# Patient Record
Sex: Female | Born: 1940 | Race: White | Hispanic: No | Marital: Married | Smoking: Never smoker
Health system: Southern US, Community
[De-identification: ages and names within clinical notes are randomized; demographics above are authoritative.]

## PROBLEM LIST (undated history)

## (undated) DIAGNOSIS — E7800 Pure hypercholesterolemia, unspecified: Secondary | ICD-10-CM

## (undated) DIAGNOSIS — M1990 Unspecified osteoarthritis, unspecified site: Secondary | ICD-10-CM

## (undated) DIAGNOSIS — F419 Anxiety disorder, unspecified: Secondary | ICD-10-CM

## (undated) HISTORY — PX: ABDOMINAL HYSTERECTOMY: SHX81

## (undated) HISTORY — PX: BLADDER SURGERY: SHX569

---

## 2013-07-27 ENCOUNTER — Emergency Department (HOSPITAL_COMMUNITY): Payer: Medicare Other

## 2013-07-27 ENCOUNTER — Emergency Department (HOSPITAL_COMMUNITY)
Admission: EM | Admit: 2013-07-27 | Discharge: 2013-07-27 | Disposition: A | Discharge status: Home / Self Care | Payer: Medicare Other | Attending: Emergency Medicine | Admitting: Emergency Medicine

## 2013-07-27 ENCOUNTER — Encounter (HOSPITAL_COMMUNITY): Payer: Self-pay | Admitting: Emergency Medicine

## 2013-07-27 DIAGNOSIS — M25559 Pain in unspecified hip: Secondary | ICD-10-CM | POA: Insufficient documentation

## 2013-07-27 DIAGNOSIS — S81009A Unspecified open wound, unspecified knee, initial encounter: Secondary | ICD-10-CM | POA: Insufficient documentation

## 2013-07-27 DIAGNOSIS — Y939 Activity, unspecified: Secondary | ICD-10-CM | POA: Insufficient documentation

## 2013-07-27 DIAGNOSIS — Z79899 Other long term (current) drug therapy: Secondary | ICD-10-CM | POA: Insufficient documentation

## 2013-07-27 DIAGNOSIS — Y92009 Unspecified place in unspecified non-institutional (private) residence as the place of occurrence of the external cause: Secondary | ICD-10-CM | POA: Insufficient documentation

## 2013-07-27 DIAGNOSIS — F411 Generalized anxiety disorder: Secondary | ICD-10-CM | POA: Insufficient documentation

## 2013-07-27 DIAGNOSIS — T148XXA Other injury of unspecified body region, initial encounter: Secondary | ICD-10-CM

## 2013-07-27 DIAGNOSIS — Z7982 Long term (current) use of aspirin: Secondary | ICD-10-CM | POA: Insufficient documentation

## 2013-07-27 DIAGNOSIS — E7800 Pure hypercholesterolemia, unspecified: Secondary | ICD-10-CM | POA: Insufficient documentation

## 2013-07-27 DIAGNOSIS — W540XXA Bitten by dog, initial encounter: Secondary | ICD-10-CM | POA: Insufficient documentation

## 2013-07-27 HISTORY — DX: Pure hypercholesterolemia, unspecified: E78.00

## 2013-07-27 HISTORY — DX: Anxiety disorder, unspecified: F41.9

## 2013-07-27 MED ORDER — TRAMADOL HCL 50 MG PO TABS: 50.0000 mg | ORAL_TABLET | Freq: Four times a day (QID) | ORAL | Status: DC | PRN

## 2013-07-27 MED ORDER — AMOXICILLIN-POT CLAVULANATE 875-125 MG PO TABS: 1.0000 | ORAL_TABLET | Freq: Two times a day (BID) | ORAL | Status: DC

## 2013-07-27 MED ORDER — TETANUS-DIPHTH-ACELL PERTUSSIS 5-2.5-18.5 LF-MCG/0.5 IM SUSP
0.5000 mL | Freq: Once | INTRAMUSCULAR | Status: AC
  Administered 2013-07-27: 0.5 mL via INTRAMUSCULAR
  Filled 2013-07-27: qty 0.5
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
## 2015-03-20 DIAGNOSIS — Z1389 Encounter for screening for other disorder: Secondary | ICD-10-CM | POA: Diagnosis not present

## 2015-03-20 DIAGNOSIS — Z01419 Encounter for gynecological examination (general) (routine) without abnormal findings: Secondary | ICD-10-CM | POA: Diagnosis not present

## 2015-03-20 DIAGNOSIS — E782 Mixed hyperlipidemia: Secondary | ICD-10-CM | POA: Diagnosis not present

## 2015-03-20 DIAGNOSIS — Z0000 Encounter for general adult medical examination without abnormal findings: Secondary | ICD-10-CM | POA: Diagnosis not present

## 2015-04-02 ENCOUNTER — Encounter (INDEPENDENT_AMBULATORY_CARE_PROVIDER_SITE_OTHER): Payer: Self-pay | Admitting: *Deleted

## 2015-07-24 DIAGNOSIS — E782 Mixed hyperlipidemia: Secondary | ICD-10-CM | POA: Diagnosis not present

## 2015-07-24 DIAGNOSIS — Z1389 Encounter for screening for other disorder: Secondary | ICD-10-CM | POA: Diagnosis not present

## 2016-10-14 DIAGNOSIS — Z1389 Encounter for screening for other disorder: Secondary | ICD-10-CM | POA: Diagnosis not present

## 2016-10-14 DIAGNOSIS — I10 Essential (primary) hypertension: Secondary | ICD-10-CM | POA: Diagnosis not present

## 2016-10-14 DIAGNOSIS — Z0001 Encounter for general adult medical examination with abnormal findings: Secondary | ICD-10-CM | POA: Diagnosis not present

## 2016-12-05 ENCOUNTER — Encounter (HOSPITAL_COMMUNITY): Payer: Self-pay | Admitting: Emergency Medicine

## 2016-12-05 ENCOUNTER — Emergency Department (HOSPITAL_COMMUNITY): Payer: Medicare Other

## 2016-12-05 ENCOUNTER — Emergency Department (HOSPITAL_COMMUNITY)
Admission: EM | Admit: 2016-12-05 | Discharge: 2016-12-05 | Disposition: A | Discharge status: Home / Self Care | Payer: Medicare Other | Attending: Emergency Medicine | Admitting: Emergency Medicine

## 2016-12-05 DIAGNOSIS — R51 Headache: Secondary | ICD-10-CM | POA: Diagnosis not present

## 2016-12-05 DIAGNOSIS — S96911A Strain of unspecified muscle and tendon at ankle and foot level, right foot, initial encounter: Secondary | ICD-10-CM | POA: Diagnosis not present

## 2016-12-05 DIAGNOSIS — Y92410 Unspecified street and highway as the place of occurrence of the external cause: Secondary | ICD-10-CM | POA: Diagnosis not present

## 2016-12-05 DIAGNOSIS — S199XXA Unspecified injury of neck, initial encounter: Secondary | ICD-10-CM | POA: Diagnosis not present

## 2016-12-05 DIAGNOSIS — Y9389 Activity, other specified: Secondary | ICD-10-CM | POA: Insufficient documentation

## 2016-12-05 DIAGNOSIS — Y999 Unspecified external cause status: Secondary | ICD-10-CM | POA: Insufficient documentation

## 2016-12-05 DIAGNOSIS — Z7982 Long term (current) use of aspirin: Secondary | ICD-10-CM | POA: Insufficient documentation

## 2016-12-05 DIAGNOSIS — S3992XA Unspecified injury of lower back, initial encounter: Secondary | ICD-10-CM | POA: Diagnosis not present

## 2016-12-05 DIAGNOSIS — S39012A Strain of muscle, fascia and tendon of lower back, initial encounter: Secondary | ICD-10-CM | POA: Insufficient documentation

## 2016-12-05 DIAGNOSIS — G9389 Other specified disorders of brain: Secondary | ICD-10-CM | POA: Insufficient documentation

## 2016-12-05 DIAGNOSIS — M25571 Pain in right ankle and joints of right foot: Secondary | ICD-10-CM | POA: Diagnosis not present

## 2016-12-05 DIAGNOSIS — M5489 Other dorsalgia: Secondary | ICD-10-CM | POA: Diagnosis not present

## 2016-12-05 DIAGNOSIS — S161XXA Strain of muscle, fascia and tendon at neck level, initial encounter: Secondary | ICD-10-CM | POA: Insufficient documentation

## 2016-12-05 DIAGNOSIS — M545 Low back pain: Secondary | ICD-10-CM | POA: Diagnosis not present

## 2016-12-05 DIAGNOSIS — M542 Cervicalgia: Secondary | ICD-10-CM | POA: Diagnosis not present

## 2016-12-05 DIAGNOSIS — T148XXA Other injury of unspecified body region, initial encounter: Secondary | ICD-10-CM | POA: Diagnosis not present

## 2016-12-05 DIAGNOSIS — S0990XA Unspecified injury of head, initial encounter: Secondary | ICD-10-CM | POA: Diagnosis not present

## 2016-12-05 MED ORDER — ACETAMINOPHEN 500 MG PO TABS
1000.0000 mg | ORAL_TABLET | Freq: Once | ORAL | Status: AC
  Administered 2016-12-05: 1000 mg via ORAL
  Filled 2016-12-05: qty 2
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
## 2017-06-16 DIAGNOSIS — Z1389 Encounter for screening for other disorder: Secondary | ICD-10-CM | POA: Diagnosis not present
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
## 2017-07-11 DIAGNOSIS — Z1211 Encounter for screening for malignant neoplasm of colon: Secondary | ICD-10-CM | POA: Diagnosis not present

## 2018-01-05 DIAGNOSIS — Z1389 Encounter for screening for other disorder: Secondary | ICD-10-CM | POA: Diagnosis not present

## 2018-01-05 DIAGNOSIS — Z0000 Encounter for general adult medical examination without abnormal findings: Secondary | ICD-10-CM | POA: Diagnosis not present

## 2018-01-05 DIAGNOSIS — E782 Mixed hyperlipidemia: Secondary | ICD-10-CM | POA: Diagnosis not present

## 2019-01-18 DIAGNOSIS — Z1389 Encounter for screening for other disorder: Secondary | ICD-10-CM | POA: Diagnosis not present

## 2019-01-18 DIAGNOSIS — Z0001 Encounter for general adult medical examination with abnormal findings: Secondary | ICD-10-CM | POA: Diagnosis not present

## 2019-02-09 DIAGNOSIS — Z1389 Encounter for screening for other disorder: Secondary | ICD-10-CM | POA: Diagnosis not present

## 2019-02-09 DIAGNOSIS — E7849 Other hyperlipidemia: Secondary | ICD-10-CM | POA: Diagnosis not present

## 2019-06-02 DIAGNOSIS — H2521 Age-related cataract, morgagnian type, right eye: Secondary | ICD-10-CM | POA: Diagnosis not present

## 2019-06-02 DIAGNOSIS — H25813 Combined forms of age-related cataract, bilateral: Secondary | ICD-10-CM | POA: Diagnosis not present

## 2019-06-13 DIAGNOSIS — H2521 Age-related cataract, morgagnian type, right eye: Secondary | ICD-10-CM | POA: Diagnosis not present
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
## 2019-06-16 ENCOUNTER — Other Ambulatory Visit (HOSPITAL_COMMUNITY)
Admission: RE | Admit: 2019-06-16 | Discharge: 2019-06-16 | Disposition: A | Discharge status: Home / Self Care | Payer: Medicare Other | Source: Ambulatory Visit | Attending: Ophthalmology | Admitting: Ophthalmology

## 2019-06-16 ENCOUNTER — Encounter (HOSPITAL_COMMUNITY)
Admission: RE | Admit: 2019-06-16 | Discharge: 2019-06-16 | Disposition: A | Discharge status: Home / Self Care | Payer: Medicare Other | Source: Ambulatory Visit | Attending: Ophthalmology | Admitting: Ophthalmology

## 2019-06-16 ENCOUNTER — Other Ambulatory Visit: Payer: Self-pay

## 2019-06-16 ENCOUNTER — Encounter (HOSPITAL_COMMUNITY): Payer: Self-pay

## 2019-06-16 DIAGNOSIS — Z01812 Encounter for preprocedural laboratory examination: Secondary | ICD-10-CM | POA: Insufficient documentation

## 2019-06-16 DIAGNOSIS — Z20828 Contact with and (suspected) exposure to other viral communicable diseases: Secondary | ICD-10-CM | POA: Diagnosis not present

## 2019-06-16 HISTORY — DX: Unspecified osteoarthritis, unspecified site: M19.90

## 2019-06-16 LAB — SARS CORONAVIRUS 2 (TAT 6-24 HRS): SARS Coronavirus 2: NEGATIVE

## 2019-06-20 ENCOUNTER — Encounter (HOSPITAL_COMMUNITY)
Admission: RE | Disposition: A | Discharge status: Home / Self Care | Payer: Self-pay | Source: Home / Self Care | Attending: Ophthalmology

## 2019-06-20 ENCOUNTER — Ambulatory Visit (HOSPITAL_COMMUNITY): Payer: Medicare Other | Admitting: Anesthesiology

## 2019-06-20 ENCOUNTER — Encounter (HOSPITAL_COMMUNITY): Payer: Self-pay

## 2019-06-20 ENCOUNTER — Other Ambulatory Visit: Payer: Self-pay

## 2019-06-20 ENCOUNTER — Ambulatory Visit (HOSPITAL_COMMUNITY)
Admission: RE | Admit: 2019-06-20 | Discharge: 2019-06-20 | Disposition: A | Discharge status: Home / Self Care | Payer: Medicare Other | Attending: Ophthalmology | Admitting: Ophthalmology

## 2019-06-20 DIAGNOSIS — H25813 Combined forms of age-related cataract, bilateral: Secondary | ICD-10-CM | POA: Insufficient documentation

## 2019-06-20 DIAGNOSIS — Z79899 Other long term (current) drug therapy: Secondary | ICD-10-CM | POA: Insufficient documentation

## 2019-06-20 DIAGNOSIS — M1990 Unspecified osteoarthritis, unspecified site: Secondary | ICD-10-CM | POA: Diagnosis not present

## 2019-06-20 DIAGNOSIS — F419 Anxiety disorder, unspecified: Secondary | ICD-10-CM | POA: Insufficient documentation

## 2019-06-20 DIAGNOSIS — Z7982 Long term (current) use of aspirin: Secondary | ICD-10-CM | POA: Diagnosis not present

## 2019-06-20 DIAGNOSIS — H2521 Age-related cataract, morgagnian type, right eye: Secondary | ICD-10-CM | POA: Diagnosis not present

## 2019-06-20 HISTORY — PX: CATARACT EXTRACTION W/PHACO: SHX586

## 2019-06-20 SURGERY — PHACOEMULSIFICATION, CATARACT, WITH IOL INSERTION
Anesthesia: Monitor Anesthesia Care | Site: Eye | Laterality: Right

## 2019-06-20 MED ORDER — CYCLOPENTOLATE-PHENYLEPHRINE 0.2-1 % OP SOLN
1.0000 [drp] | OPHTHALMIC | Status: AC | PRN
  Administered 2019-06-20 (×3): 1 [drp] via OPHTHALMIC

## 2019-06-20 MED ORDER — LIDOCAINE HCL 3.5 % OP GEL
1.0000 "application " | Freq: Once | OPHTHALMIC | Status: AC
  Administered 2019-06-20: 1 via OPHTHALMIC

## 2019-06-20 MED ORDER — POVIDONE-IODINE 5 % OP SOLN
OPHTHALMIC | Status: DC | PRN
  Administered 2019-06-20: 1 via OPHTHALMIC

## 2019-06-20 MED ORDER — MIDAZOLAM HCL 2 MG/2ML IJ SOLN
INTRAMUSCULAR | Status: DC | PRN
  Administered 2019-06-20 (×2): 0.5 mg via INTRAVENOUS
  Administered 2019-06-20: 1 mg via INTRAVENOUS

## 2019-06-20 MED ORDER — BSS IO SOLN
INTRAOCULAR | Status: DC | PRN
  Administered 2019-06-20: 15 mL via INTRAOCULAR

## 2019-06-20 MED ORDER — TRYPAN BLUE 0.06 % OP SOLN
OPHTHALMIC | Status: DC | PRN
  Administered 2019-06-20: 0.5 mL via INTRAOCULAR

## 2019-06-20 MED ORDER — PROVISC 10 MG/ML IO SOLN
INTRAOCULAR | Status: DC | PRN
  Administered 2019-06-20: 0.85 mL via INTRAOCULAR

## 2019-06-20 MED ORDER — SODIUM HYALURONATE 23 MG/ML IO SOLN
INTRAOCULAR | Status: DC | PRN
  Administered 2019-06-20: 0.6 mL via INTRAOCULAR

## 2019-06-20 MED ORDER — PHENYLEPHRINE-KETOROLAC 1-0.3 % IO SOLN
INTRAOCULAR | Status: AC
  Filled 2019-06-20: qty 4

## 2019-06-20 MED ORDER — PHENYLEPHRINE-KETOROLAC 1-0.3 % IO SOLN
INTRAOCULAR | Status: DC | PRN
  Administered 2019-06-20: 500 mL via OPHTHALMIC

## 2019-06-20 MED ORDER — PHENYLEPHRINE HCL 2.5 % OP SOLN
1.0000 [drp] | OPHTHALMIC | Status: AC | PRN
  Administered 2019-06-20 (×3): 1 [drp] via OPHTHALMIC

## 2019-06-20 MED ORDER — EPINEPHRINE PF 1 MG/ML IJ SOLN
INTRAMUSCULAR | Status: AC
  Filled 2019-06-20: qty 2

## 2019-06-20 MED ORDER — LIDOCAINE HCL (PF) 1 % IJ SOLN
INTRAOCULAR | Status: DC | PRN
  Administered 2019-06-20: 09:00:00 1 mL via OPHTHALMIC

## 2019-06-20 MED ORDER — TRYPAN BLUE 0.06 % OP SOLN
OPHTHALMIC | Status: AC
  Filled 2019-06-20: qty 0.5

## 2019-06-20 MED ORDER — TETRACAINE HCL 0.5 % OP SOLN
1.0000 [drp] | OPHTHALMIC | Status: AC | PRN
  Administered 2019-06-20 (×3): 1 [drp] via OPHTHALMIC

## 2019-06-20 MED ORDER — NEOMYCIN-POLYMYXIN-DEXAMETH 3.5-10000-0.1 OP SUSP
OPHTHALMIC | Status: DC | PRN
  Administered 2019-06-20: 1 [drp] via OPHTHALMIC

## 2019-06-20 MED ORDER — MIDAZOLAM HCL 2 MG/2ML IJ SOLN
INTRAMUSCULAR | Status: AC
  Filled 2019-06-20: qty 2

## 2019-06-20 SURGICAL SUPPLY — 14 items
CLOTH BEACON ORANGE TIMEOUT ST (SAFETY) ×3 IMPLANT
DEVICE MILOOP (MISCELLANEOUS) ×2 IMPLANT
EYE SHIELD UNIVERSAL CLEAR (GAUZE/BANDAGES/DRESSINGS) ×3 IMPLANT
GLOVE BIOGEL PI IND STRL 7.0 (GLOVE) ×2 IMPLANT
GLOVE BIOGEL PI INDICATOR 7.0 (GLOVE) ×4
LENS ALC ACRYL/TECN (Ophthalmic Related) ×3 IMPLANT
MILOOP DEVICE (MISCELLANEOUS) ×6
NEEDLE HYPO 18GX1.5 BLUNT FILL (NEEDLE) ×3 IMPLANT
PAD ARMBOARD 7.5X6 YLW CONV (MISCELLANEOUS) ×3 IMPLANT
SYR TB 1ML LL NO SAFETY (SYRINGE) ×3 IMPLANT
TAPE SURG TRANSPORE 1 IN (GAUZE/BANDAGES/DRESSINGS) ×1 IMPLANT
TAPE SURGICAL TRANSPORE 1 IN (GAUZE/BANDAGES/DRESSINGS) ×2
VISCOELASTIC ADDITIONAL (OPHTHALMIC RELATED) ×3 IMPLANT
WATER STERILE IRR 250ML POUR (IV SOLUTION) ×3 IMPLANT

## 2019-06-20 NOTE — Discharge Instructions (Addendum)

## 2019-06-20 NOTE — Transfer of Care (Signed)
Immediate Anesthesia Transfer of Care Note  Patient: Kimberly Melendez  Procedure(s) Performed: CATARACT EXTRACTION PHACO AND INTRAOCULAR LENS PLACEMENT RIGHT EYE (Right Eye)  Patient Location: Short Stay  Anesthesia Type:MAC  Level of Consciousness: awake, alert , oriented and patient cooperative  Airway & Oxygen Therapy: Patient Spontanous Breathing  Post-op Assessment: Report given to RN and Post -op Vital signs reviewed and stable  Post vital signs: Reviewed and stable  Last Vitals:  Vitals Value Taken Time  BP    Temp    Pulse    Resp    SpO2      Last Pain:  Vitals:   06/20/19 0725  TempSrc: Oral  PainSc: 0-No pain      Patients Stated Pain Goal: 6 (16/10/96 0454)  Complications: No apparent anesthesia complications

## 2019-06-20 NOTE — Anesthesia Preprocedure Evaluation (Signed)
Anesthesia Evaluation  Patient identified by MRN, date of birth, ID band Patient awake    Reviewed: Allergy & Precautions, NPO status , Patient's Chart, lab work & pertinent test results  Airway Mallampati: II  TM Distance: >3 FB     Dental  (+) Partial Upper   Pulmonary    Pulmonary exam normal breath sounds clear to auscultation       Cardiovascular Exercise Tolerance: Good negative cardio ROS Normal cardiovascular exam Rhythm:Regular Rate:Normal     Neuro/Psych Anxiety    GI/Hepatic negative GI ROS, Neg liver ROS,   Endo/Other  negative endocrine ROS  Renal/GU      Musculoskeletal  (+) Arthritis , Osteoarthritis,    Abdominal   Peds  Hematology negative hematology ROS (+)   Anesthesia Other Findings   Reproductive/Obstetrics                             Anesthesia Physical Anesthesia Plan  ASA: II  Anesthesia Plan: MAC   Post-op Pain Management:    Induction:   PONV Risk Score and Plan:   Airway Management Planned: Natural Airway and Nasal Cannula  Additional Equipment:   Intra-op Plan:   Post-operative Plan:   Informed Consent: I have reviewed the patients History and Physical, chart, labs and discussed the procedure including the risks, benefits and alternatives for the proposed anesthesia with the patient or authorized representative who has indicated his/her understanding and acceptance.     Dental advisory given  Plan Discussed with: CRNA  Anesthesia Plan Comments:         Anesthesia Quick Evaluation

## 2019-06-20 NOTE — Anesthesia Postprocedure Evaluation (Signed)
Anesthesia Post Note  Patient: Kimberly Melendez  Procedure(s) Performed: CATARACT EXTRACTION PHACO AND INTRAOCULAR LENS PLACEMENT RIGHT EYE (Right Eye)  Patient location during evaluation: Short Stay Anesthesia Type: MAC Level of consciousness: awake and alert Pain management: pain level controlled Vital Signs Assessment: post-procedure vital signs reviewed and stable Respiratory status: spontaneous breathing Cardiovascular status: stable Anesthetic complications: no     Last Vitals:  Vitals:   06/20/19 0725  BP: (!) 146/76  Pulse: 73  Resp: 16  Temp: 36.6 C  SpO2: 94%    Last Pain:  Vitals:   06/20/19 0725  TempSrc: Oral  PainSc: 0-No pain                 Everette Rank

## 2019-06-20 NOTE — Interval H&P Note (Signed)
History and Physical Interval Note: The H and P was reviewed and updated. The patient was examined.  No changes were found after exam.  The surgical eye was marked.  06/20/2019 8:29 AM  Kimberly Melendez  has presented today for surgery, with the diagnosis of Nuclear sclerotic cataract - Right eye.  The various methods of treatment have been discussed with the patient and family. After consideration of risks, benefits and other options for treatment, the patient has consented to  Procedure(s) with comments: CATARACT EXTRACTION PHACO AND INTRAOCULAR LENS PLACEMENT RIGHT EYE (Right) - right as a surgical intervention.  The patient's history has been reviewed, patient examined, no change in status, stable for surgery.  I have reviewed the patient's chart and labs.  Questions were answered to the patient's satisfaction.     Baruch Goldmann

## 2019-06-20 NOTE — Op Note (Signed)
Date of procedure: 06/20/19  Pre-operative diagnosis: Mature Visually significant age-related cataract, Right Eye (H25.21)  Post-operative diagnosis: Mature Visually significant age-related cataract, Right Eye  Procedure: Complex Removal of cataract via phacoemulsification and insertion of intra-ocular lens AMO PCB00 +16.5D into the capsular bag of the Right Eye  Attending surgeon: Gerda Diss. Deone Leifheit, MD, MA  Anesthesia: MAC, Topical Akten  Complications: None  Estimated Blood Loss: <64m (minimal)  Specimens: None  Implants: As above  Indications:  Mature Visually significant age-related cataract, Right Eye  Procedure:  The patient was seen and identified in the pre-operative area. The operative eye was identified and dilated.  The operative eye was marked.  Topical anesthesia was administered to the operative eye.     The patient was then to the operative suite and placed in the supine position.  A timeout was performed confirming the patient, procedure to be performed, and all other relevant information.   The patient's face was prepped and draped in the usual fashion for intra-ocular surgery.  A lid speculum was placed into the operative eye and the surgical microscope moved into place and focused.  A lack of red reflex due to a mature cataract was confirmed.  A superotemporal paracentesis was created using a 20 gauge paracentesis blade.  Vision blue was injected into the anterior chamber.  Shugarcaine was injected into the anterior chamber.  Viscoelastic was injected into the anterior chamber.  A temporal clear-corneal main wound incision was created using a 2.489mmicrokeratome.  A continuous curvilinear capsulorrhexis was initiated using an irrigating cystitome and completed using capsulorrhexis forceps.  Hydrodissection and hydrodeliniation were performed.  Viscoelastic was injected into the anterior chamber.  A miLoop was used to bisect the nucleus.  A phacoemulsification handpiece and  a chopper as a second instrument were used to remove the nucleus and epinucleus. The irrigation/aspiration handpiece was used to remove any remaining cortical material.   The capsular bag was reinflated with viscoelastic, checked, and found to be intact. The intraocular lens was inserted into the capsular bag and dialed into place using a kuglen hook.  The irrigation/aspiration handpiece was used to remove any remaining viscoelastic.  The clear corneal wound and paracentesis wounds were then hydrated and checked with Weck-Cels to be watertight.  The lid-speculum and drape was removed, and the patient's face was cleaned with a wet and dry 4x4.  Maxitrol was instilled in the eye before a clear shield was taped over the eye. The patient was taken to the post-operative care unit in good condition, having tolerated the procedure well.  Post-Op Instructions: [facility] and will receive all other orders and instructions.

## 2019-06-21 ENCOUNTER — Encounter (HOSPITAL_COMMUNITY): Payer: Self-pay | Admitting: Ophthalmology

## 2019-06-21 NOTE — Addendum Note (Signed)
Addendum  created 06/21/19 0807 by Mickel Baas, CRNA   Charge Capture section accepted

## 2019-06-27 DIAGNOSIS — H25812 Combined forms of age-related cataract, left eye: Secondary | ICD-10-CM | POA: Diagnosis not present

## 2019-06-28 NOTE — H&P (Signed)
Surgical History & Physical  Patient Name: Kimberly Melendez DOB: [birth year]  Surgery: Cataract extraction with intraocular lens implant phacoemulsification; Left Eye  Surgeon: Baruch Goldmann MD Surgery Date:  07/04/2019 Pre-Op Date:  06/27/2019  HPI: [name] 1. 1. The patient complains of difficulty when viewing TV, reading closed caption, news scrolls on TV, which began 6 months ago. The left eye is affected. The episode is gradual. Symptoms occur when the patient is inside, outside and reading. This is negatively affecting the patient's quality of life. 2. The patient is returning after cataract post-op. The right eye is affected. Status post cataract post-op, 1 week ago: Since the last visit, the affected area is doing well. The patient's vision is improved, pt states she really amazed with the result. Patient is following medication instructions. HPI was performed by Baruch Goldmann .  Medical History: Cataracts Anxiety LDL  Review of Systems Negative Allergic/Immunologic Negative Cardiovascular Negative Constitutional Negative Ear, Nose, Mouth & Throat Negative Endocrine Negative Gastrointestinal Negative Genitourinary Negative Hemotologic/Lymphatic Negative Integumentary Negative Musculoskeletal Negative Neurological Negative Respiratory  Social   Never smoked  Medication Prednisolone-gatiflox-bromfenac,  Aspirin, Clonazepam, Pravastatin,   Sx/Procedures Phaco c IOL,  Hysterectomy,   Drug Allergies   NKDA  History & Physical: Heent:  Cataract, Left eye NECK: supple without bruits LUNGS: lungs clear to auscultation CV: regular rate and rhythm Abdomen: soft and non-tender  Impression & Plan: Assessment: 1.  COMBINED FORMS AGE RELATED CATARACT; Left Eye (H25.812) 2.  CATARACT EXTRACTION STATUS; Right Eye (Z98.41) 3.  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  Cataract accounts for the patient's decreased vision. This visual impairment is not  correctable with a tolerable change in glasses or contact lenses. Cataract surgery with an implantation of a new lens should significantly improve the visual and functional status of the patient. Discussed all risks, benefits, alternatives, and potential complications. Discussed the procedures and recovery. Patient desires to have surgery. A-scan ordered and performed today for intra-ocular lens calculations. The surgery will be performed in order to improve vision for driving, reading, and for eye examinations. Recommend phacoemulsification with intra-ocular lens. Left Eye. Surgery required to correct imbalance of vision. Dilates well - shugarcaine by protocol. 2.  1 week after cataract surgery. Doing well with improved vision and normal eye pressure. Call with any problems or concerns. Continue Gati-Brom-Pred 2x/day for 3 more weeks.

## 2019-06-30 ENCOUNTER — Encounter (HOSPITAL_COMMUNITY): Payer: Self-pay

## 2019-06-30 ENCOUNTER — Other Ambulatory Visit: Payer: Self-pay

## 2019-06-30 ENCOUNTER — Encounter (HOSPITAL_COMMUNITY)
Admission: RE | Admit: 2019-06-30 | Discharge: 2019-06-30 | Disposition: A | Discharge status: Home / Self Care | Payer: Medicare Other | Source: Ambulatory Visit | Attending: Ophthalmology | Admitting: Ophthalmology

## 2019-07-01 ENCOUNTER — Other Ambulatory Visit (HOSPITAL_COMMUNITY)
Admission: RE | Admit: 2019-07-01 | Discharge: 2019-07-01 | Disposition: A | Discharge status: Home / Self Care | Payer: Medicare Other | Source: Ambulatory Visit | Attending: Ophthalmology | Admitting: Ophthalmology

## 2019-07-01 ENCOUNTER — Other Ambulatory Visit: Payer: Self-pay

## 2019-07-01 DIAGNOSIS — Z01812 Encounter for preprocedural laboratory examination: Secondary | ICD-10-CM | POA: Insufficient documentation

## 2019-07-01 DIAGNOSIS — Z20828 Contact with and (suspected) exposure to other viral communicable diseases: Secondary | ICD-10-CM | POA: Diagnosis not present

## 2019-07-01 LAB — SARS CORONAVIRUS 2 (TAT 6-24 HRS): SARS Coronavirus 2: NEGATIVE

## 2019-07-04 ENCOUNTER — Encounter (HOSPITAL_COMMUNITY)
Admission: RE | Disposition: A | Discharge status: Home / Self Care | Payer: Self-pay | Source: Home / Self Care | Attending: Ophthalmology

## 2019-07-04 ENCOUNTER — Ambulatory Visit (HOSPITAL_COMMUNITY): Payer: Medicare Other | Admitting: Anesthesiology

## 2019-07-04 ENCOUNTER — Ambulatory Visit (HOSPITAL_COMMUNITY)
Admission: RE | Admit: 2019-07-04 | Discharge: 2019-07-04 | Disposition: A | Discharge status: Home / Self Care | Payer: Medicare Other | Attending: Ophthalmology | Admitting: Ophthalmology

## 2019-07-04 DIAGNOSIS — F419 Anxiety disorder, unspecified: Secondary | ICD-10-CM | POA: Insufficient documentation

## 2019-07-04 DIAGNOSIS — Z7982 Long term (current) use of aspirin: Secondary | ICD-10-CM | POA: Diagnosis not present

## 2019-07-04 DIAGNOSIS — H5203 Hypermetropia, bilateral: Secondary | ICD-10-CM | POA: Insufficient documentation

## 2019-07-04 DIAGNOSIS — H25812 Combined forms of age-related cataract, left eye: Secondary | ICD-10-CM | POA: Insufficient documentation

## 2019-07-04 DIAGNOSIS — Z79899 Other long term (current) drug therapy: Secondary | ICD-10-CM | POA: Diagnosis not present

## 2019-07-04 HISTORY — PX: CATARACT EXTRACTION W/PHACO: SHX586

## 2019-07-04 SURGERY — PHACOEMULSIFICATION, CATARACT, WITH IOL INSERTION
Anesthesia: Monitor Anesthesia Care | Site: Eye | Laterality: Left

## 2019-07-04 MED ORDER — CYCLOPENTOLATE-PHENYLEPHRINE 0.2-1 % OP SOLN
1.0000 [drp] | OPHTHALMIC | Status: AC | PRN
  Administered 2019-07-04 (×3): 1 [drp] via OPHTHALMIC

## 2019-07-04 MED ORDER — TRYPAN BLUE 0.06 % OP SOLN
OPHTHALMIC | Status: AC
  Filled 2019-07-04: qty 0.5

## 2019-07-04 MED ORDER — PHENYLEPHRINE-KETOROLAC 1-0.3 % IO SOLN
INTRAOCULAR | Status: DC | PRN
  Administered 2019-07-04: 500 mL via OPHTHALMIC

## 2019-07-04 MED ORDER — LIDOCAINE HCL 3.5 % OP GEL
1.0000 "application " | Freq: Once | OPHTHALMIC | Status: AC
  Administered 2019-07-04: 1 via OPHTHALMIC

## 2019-07-04 MED ORDER — NEOMYCIN-POLYMYXIN-DEXAMETH 3.5-10000-0.1 OP SUSP
OPHTHALMIC | Status: DC | PRN
  Administered 2019-07-04: 2 [drp] via OPHTHALMIC

## 2019-07-04 MED ORDER — POVIDONE-IODINE 5 % OP SOLN
OPHTHALMIC | Status: DC | PRN
  Administered 2019-07-04: 1 via OPHTHALMIC

## 2019-07-04 MED ORDER — TETRACAINE HCL 0.5 % OP SOLN
1.0000 [drp] | OPHTHALMIC | Status: AC | PRN
  Administered 2019-07-04 (×3): 1 [drp] via OPHTHALMIC

## 2019-07-04 MED ORDER — BSS IO SOLN
INTRAOCULAR | Status: DC | PRN
  Administered 2019-07-04: 15 mL via INTRAOCULAR

## 2019-07-04 MED ORDER — PROVISC 10 MG/ML IO SOLN
INTRAOCULAR | Status: DC | PRN
  Administered 2019-07-04: 0.85 mL via INTRAOCULAR

## 2019-07-04 MED ORDER — PHENYLEPHRINE HCL 2.5 % OP SOLN
1.0000 [drp] | OPHTHALMIC | Status: AC | PRN
  Administered 2019-07-04 (×3): 1 [drp] via OPHTHALMIC

## 2019-07-04 MED ORDER — PHENYLEPHRINE-KETOROLAC 1-0.3 % IO SOLN
INTRAOCULAR | Status: AC
  Filled 2019-07-04: qty 4

## 2019-07-04 MED ORDER — SODIUM HYALURONATE 23 MG/ML IO SOLN
INTRAOCULAR | Status: DC | PRN
  Administered 2019-07-04: 0.6 mL via INTRAOCULAR

## 2019-07-04 MED ORDER — LIDOCAINE HCL (PF) 1 % IJ SOLN
INTRAOCULAR | Status: DC | PRN
  Administered 2019-07-04: 1 mL via OPHTHALMIC

## 2019-07-04 SURGICAL SUPPLY — 12 items

## 2019-07-04 NOTE — Op Note (Signed)
Date of procedure: 07/04/19  Pre-operative diagnosis: Visually significant age-related combined cataract, Left Eye (H25.812)  Post-operative diagnosis: Visually significant age-related combined cataract, Left Eye (H25.812)  Procedure: Removal of cataract via phacoemulsification and insertion of intra-ocular lens [place]  +18.0D into the capsular bag of the Left Eye  Attending surgeon: Gerda Diss. Trevious Rampey, MD, MA  Anesthesia: MAC, Topical Akten  Complications: None  Estimated Blood Loss: <47m (minimal)  Specimens: None  Implants: As above  Indications:  Visually significant age-related cataract, Left Eye  Procedure:  The patient was seen and identified in the pre-operative area. The operative eye was identified and dilated.  The operative eye was marked.  Topical anesthesia was administered to the operative eye.     The patient was then to the operative suite and placed in the supine position.  A timeout was performed confirming the patient, procedure to be performed, and all other relevant information.   The patient's face was prepped and draped in the usual fashion for intra-ocular surgery.  A lid speculum was placed into the operative eye and the surgical microscope moved into place and focused.  An inferotemporal paracentesis was created using a 20 gauge paracentesis blade.  Shugarcaine was injected into the anterior chamber.  Viscoelastic was injected into the anterior chamber.  A temporal clear-corneal main wound incision was created using a 2.431mmicrokeratome.  A continuous curvilinear capsulorrhexis was initiated using an irrigating cystitome and completed using capsulorrhexis forceps.  Hydrodissection and hydrodeliniation were performed.  Viscoelastic was injected into the anterior chamber.  A phacoemulsification handpiece and a chopper as a second instrument were used to remove the nucleus and epinucleus. The irrigation/aspiration handpiece was used to remove  any remaining cortical material.   The capsular bag was reinflated with viscoelastic, checked, and found to be intact.  The intraocular lens was inserted into the capsular bag.  The irrigation/aspiration handpiece was used to remove any remaining viscoelastic.  The clear corneal wound and paracentesis wounds were then hydrated and checked with Weck-Cels to be watertight.  The lid-speculum and drape was removed, and the patient's face was cleaned with a wet and dry 4x4.  Maxitrol was instilled in the eye before a clear shield was taped over the eye. The patient was taken to the post-operative care unit in good condition, having tolerated the procedure well.  Post-Op Instructions: [facility] and will receive all other orders and instructions.

## 2019-07-04 NOTE — Anesthesia Postprocedure Evaluation (Signed)
Anesthesia Post Note  Patient: Kimberly Melendez  Procedure(s) Performed: CATARACT EXTRACTION PHACO AND INTRAOCULAR LENS PLACEMENT LEFT EYE  (CDE: 38.77 ) (Left Eye)  Patient location during evaluation: PACU Anesthesia Type: MAC Level of consciousness: awake and alert and oriented Pain management: pain level controlled Vital Signs Assessment: post-procedure vital signs reviewed and stable Respiratory status: spontaneous breathing Cardiovascular status: stable Postop Assessment: no apparent nausea or vomiting Anesthetic complications: no     Last Vitals:  Vitals:   07/04/19 0842  BP: (!) 149/78  Pulse: 73  Resp: 16  Temp: 36.7 C  SpO2: 95%    Last Pain:  Vitals:   07/04/19 0842  TempSrc: Oral  PainSc: 0-No pain                 Becki Mccaskill

## 2019-07-04 NOTE — Discharge Instructions (Signed)
Please discharge patient when stable, will follow up today with Dr. Altovise Wahler at the [facility] office immediately following discharge.  Leave shield in place until visit.  All paperwork with discharge instructions will be given at the office. ° °

## 2019-07-04 NOTE — Anesthesia Preprocedure Evaluation (Signed)
Anesthesia Evaluation  Patient identified by MRN, date of birth, ID band Patient awake    Reviewed: Allergy & Precautions, NPO status , Patient's Chart, lab work & pertinent test results  Airway Mallampati: II  TM Distance: >3 FB Neck ROM: Full    Dental no notable dental hx. (+) Partial Upper   Pulmonary neg pulmonary ROS,    Pulmonary exam normal breath sounds clear to auscultation       Cardiovascular Exercise Tolerance: Good negative cardio ROS Normal cardiovascular examI Rhythm:Regular Rate:Normal     Neuro/Psych Anxiety negative neurological ROS  negative psych ROS   GI/Hepatic negative GI ROS, Neg liver ROS,   Endo/Other  negative endocrine ROS  Renal/GU negative Renal ROS  negative genitourinary   Musculoskeletal  (+) Arthritis , Osteoarthritis,    Abdominal   Peds negative pediatric ROS (+)  Hematology negative hematology ROS (+)   Anesthesia Other Findings   Reproductive/Obstetrics negative OB ROS                             Anesthesia Physical Anesthesia Plan  ASA: II  Anesthesia Plan: MAC   Post-op Pain Management:    Induction: Intravenous  PONV Risk Score and Plan: 2 and Ondansetron, Treatment may vary due to age or medical condition and TIVA  Airway Management Planned: Nasal Cannula and Simple Face Mask  Additional Equipment:   Intra-op Plan:   Post-operative Plan:   Informed Consent: I have reviewed the patients History and Physical, chart, labs and discussed the procedure including the risks, benefits and alternatives for the proposed anesthesia with the patient or authorized representative who has indicated his/her understanding and acceptance.     Dental advisory given  Plan Discussed with: CRNA  Anesthesia Plan Comments: (Plan Full PPE use  Plan MAC -WTP with same after Q&A  Second eye IOL last 06/20/19 - did great last time  It's her Rudene Anda  today )        Anesthesia Quick Evaluation

## 2019-07-04 NOTE — Transfer of Care (Signed)
Immediate Anesthesia Transfer of Care Note  Patient: Kimberly Melendez  Procedure(s) Performed: CATARACT EXTRACTION PHACO AND INTRAOCULAR LENS PLACEMENT LEFT EYE  (CDE: 38.77 ) (Left Eye)  Patient Location: PACU  Anesthesia Type:MAC  Level of Consciousness: awake  Airway & Oxygen Therapy: Patient Spontanous Breathing  Post-op Assessment: Report given to RN  Post vital signs: Reviewed  Last Vitals:  Vitals Value Taken Time  BP    Temp    Pulse    Resp    SpO2      Last Pain:  Vitals:   07/04/19 0842  TempSrc: Oral  PainSc: 0-No pain      Patients Stated Pain Goal: 6 (38/45/36 [id number])  Complications: No apparent anesthesia complications

## 2019-07-04 NOTE — Interval H&P Note (Signed)
History and Physical Interval Note: The H and P was reviewed and updated. The patient was examined.  No changes were found after exam.  The surgical eye was marked.  07/04/2019 9:49 AM  Carolin Coy  has presented today for surgery, with the diagnosis of Nuclear sclerotic cataract - Left eye.  The various methods of treatment have been discussed with the patient and family. After consideration of risks, benefits and other options for treatment, the patient has consented to  Procedure(s) with comments: CATARACT EXTRACTION PHACO AND INTRAOCULAR LENS PLACEMENT ([place]) (Left) - left-pt notified of new arrival time 8:55am as a surgical intervention.  The patient's history has been reviewed, patient examined, no change in status, stable for surgery.  I have reviewed the patient's chart and labs.  Questions were answered to the patient's satisfaction.     Baruch Goldmann

## 2019-07-05 ENCOUNTER — Encounter (HOSPITAL_COMMUNITY): Payer: Self-pay | Admitting: Ophthalmology

## 2019-07-05 NOTE — Addendum Note (Signed)
Addendum  created 07/05/19 1031 by Mickel Baas, CRNA   Charge Capture section accepted

## 2019-11-08 DIAGNOSIS — E7849 Other hyperlipidemia: Secondary | ICD-10-CM | POA: Diagnosis not present

## 2019-11-08 DIAGNOSIS — Z1389 Encounter for screening for other disorder: Secondary | ICD-10-CM | POA: Diagnosis not present

## 2019-11-08 DIAGNOSIS — Z0000 Encounter for general adult medical examination without abnormal findings: Secondary | ICD-10-CM | POA: Diagnosis not present

## 2019-11-08 DIAGNOSIS — R7309 Other abnormal glucose: Secondary | ICD-10-CM | POA: Diagnosis not present

## 2020-11-20 DIAGNOSIS — Z1389 Encounter for screening for other disorder: Secondary | ICD-10-CM | POA: Diagnosis not present

## 2020-11-20 DIAGNOSIS — E7849 Other hyperlipidemia: Secondary | ICD-10-CM | POA: Diagnosis not present

## 2020-11-20 DIAGNOSIS — Z0000 Encounter for general adult medical examination without abnormal findings: Secondary | ICD-10-CM | POA: Diagnosis not present

## 2020-11-20 DIAGNOSIS — R7309 Other abnormal glucose: Secondary | ICD-10-CM | POA: Diagnosis not present

## 2021-03-19 DIAGNOSIS — E782 Mixed hyperlipidemia: Secondary | ICD-10-CM | POA: Diagnosis not present

## 2021-06-04 DIAGNOSIS — Z1211 Encounter for screening for malignant neoplasm of colon: Secondary | ICD-10-CM | POA: Diagnosis not present

## 2021-11-26 ENCOUNTER — Other Ambulatory Visit (HOSPITAL_COMMUNITY): Payer: Self-pay | Admitting: Internal Medicine

## 2021-11-26 ENCOUNTER — Other Ambulatory Visit: Payer: Self-pay | Admitting: Internal Medicine

## 2021-11-26 DIAGNOSIS — E782 Mixed hyperlipidemia: Secondary | ICD-10-CM | POA: Diagnosis not present

## 2021-11-26 DIAGNOSIS — Z0001 Encounter for general adult medical examination with abnormal findings: Secondary | ICD-10-CM | POA: Diagnosis not present

## 2021-11-26 DIAGNOSIS — R0989 Other specified symptoms and signs involving the circulatory and respiratory systems: Secondary | ICD-10-CM

## 2021-11-26 DIAGNOSIS — R7309 Other abnormal glucose: Secondary | ICD-10-CM | POA: Diagnosis not present

## 2021-11-26 DIAGNOSIS — E559 Vitamin D deficiency, unspecified: Secondary | ICD-10-CM | POA: Diagnosis not present

## 2022-12-29 DIAGNOSIS — Z0001 Encounter for general adult medical examination with abnormal findings: Secondary | ICD-10-CM | POA: Diagnosis not present

## 2022-12-29 DIAGNOSIS — R7309 Other abnormal glucose: Secondary | ICD-10-CM | POA: Diagnosis not present

## 2022-12-29 DIAGNOSIS — E7849 Other hyperlipidemia: Secondary | ICD-10-CM | POA: Diagnosis not present

## 2022-12-29 DIAGNOSIS — E782 Mixed hyperlipidemia: Secondary | ICD-10-CM | POA: Diagnosis not present

## 2024-01-05 DIAGNOSIS — Z9229 Personal history of other drug therapy: Secondary | ICD-10-CM | POA: Diagnosis not present

## 2024-01-05 DIAGNOSIS — E782 Mixed hyperlipidemia: Secondary | ICD-10-CM | POA: Diagnosis not present

## 2024-01-05 DIAGNOSIS — Z0001 Encounter for general adult medical examination with abnormal findings: Secondary | ICD-10-CM | POA: Diagnosis not present

## 2024-01-07 DIAGNOSIS — Z0001 Encounter for general adult medical examination with abnormal findings: Secondary | ICD-10-CM | POA: Diagnosis not present

## 2024-02-16 ENCOUNTER — Ambulatory Visit: Admitting: Physician Assistant

## 2024-02-16 ENCOUNTER — Ambulatory Visit (INDEPENDENT_AMBULATORY_CARE_PROVIDER_SITE_OTHER): Admitting: Physician Assistant

## 2024-02-16 ENCOUNTER — Encounter: Payer: Self-pay | Admitting: Physician Assistant

## 2024-02-16 VITALS — BP 136/78 | HR 70 | Temp 98.6°F | Ht 64.0 in | Wt 163.8 lb

## 2024-02-16 DIAGNOSIS — E782 Mixed hyperlipidemia: Secondary | ICD-10-CM

## 2024-02-16 DIAGNOSIS — E785 Hyperlipidemia, unspecified: Secondary | ICD-10-CM | POA: Insufficient documentation

## 2024-02-16 DIAGNOSIS — Z7689 Persons encountering health services in other specified circumstances: Secondary | ICD-10-CM

## 2024-02-16 MED ORDER — PRAVASTATIN SODIUM 80 MG PO TABS: 80.0000 mg | ORAL_TABLET | Freq: Every day | ORAL | 3 refills | Status: AC

## 2024-02-16 NOTE — Assessment & Plan Note (Signed)
 Stable. Continue with current management without changes. Discussed healthy diet and lifestyle. Patient to bring recent lab work to office to be scanned into chart.

## 2024-02-16 NOTE — Progress Notes (Signed)
 New Patient Office Visit  Subjective    Patient ID: Kimberly Melendez, female    DOB: 07-25-1941  Age: 83 y.o. MRN: [id number]  CC: No chief complaint on file.   HPI Kimberly Melendez presents to establish care  Patient presents today with past medical history significant for hyperlipidemia, otherwise is a healthy 83 year old female. She has no complaints today other than to establish care. She reports she was seen by her previous provider in May for her annual physical. She states she has medical records at home and can provide them at her next visit. Patient is due for shingles and pneumococcal vaccinations and DEXA scan. She defers all preventative care at this time.   Outpatient Encounter Medications as of 02/16/2024  Medication Sig   acetaminophen  (TYLENOL ) 500 MG tablet Take 500 mg by mouth every 6 (six) hours as needed for moderate pain or headache.   aspirin EC 81 MG tablet Take 81 mg by mouth daily.    clorazepate (TRANXENE) 7.5 MG tablet Take 7.5 mg by mouth at bedtime.    Omega-3 Fatty Acids (OMEGA 3 PO) Take 1 capsule by mouth daily.    [DISCONTINUED] pravastatin (PRAVACHOL) 80 MG tablet Take 80 mg by mouth daily.    pravastatin (PRAVACHOL) 80 MG tablet Take 1 tablet (80 mg total) by mouth daily.   No facility-administered encounter medications on file as of 02/16/2024.    Past Medical History:  Diagnosis Date   Anxiety    Arthritis    Hypercholesteremia     Past Surgical History:  Procedure Laterality Date   ABDOMINAL HYSTERECTOMY     BLADDER SURGERY     CATARACT EXTRACTION W/PHACO Right 06/20/2019   Procedure: CATARACT EXTRACTION PHACO AND INTRAOCULAR LENS PLACEMENT RIGHT EYE;  Surgeon: Harrie Agent, MD;  Location: [facility];  Service: Ophthalmology;  Laterality: Right;  CDE: 64.52   CATARACT EXTRACTION W/PHACO Left 07/04/2019   Procedure: CATARACT EXTRACTION PHACO AND INTRAOCULAR LENS PLACEMENT LEFT EYE  (CDE: 38.77 );  Surgeon: Harrie Agent, MD;  Location: [facility];   Service: Ophthalmology;  Laterality: Left;    No family history on file.  Social History   Socioeconomic History   Marital status: Married    Spouse name: Not on file   Number of children: Not on file   Years of education: Not on file   Highest education level: Not on file  Occupational History   Not on file  Tobacco Use   Smoking status: Never   Smokeless tobacco: Never  Vaping Use   Vaping status: Not on file  Substance and Sexual Activity   Alcohol use: No   Drug use: No   Sexual activity: Not Currently  Other Topics Concern   Not on file  Social History Narrative   Not on file   Social Drivers of Health   Financial Resource Strain: Not on file  Food Insecurity: Not on file  Transportation Needs: Not on file  Physical Activity: Not on file  Stress: Not on file  Social Connections: Not on file  Intimate Partner Violence: Not on file    Review of Systems  Constitutional:  Negative for chills, fever and malaise/fatigue.  Eyes:  Negative for blurred vision and double vision.  Respiratory:  Negative for cough and shortness of breath.   Cardiovascular:  Negative for chest pain and palpitations.  Musculoskeletal:  Negative for joint pain and myalgias.  Neurological:  Negative for dizziness and headaches.  Psychiatric/Behavioral:  Negative  for depression. The patient is not nervous/anxious.       Objective    BP 136/78   Pulse 70   Temp 98.6 F (37 C)   Ht 5' 4 (1.626 m)   Wt 163 lb 12.8 oz (74.3 kg)   SpO2 98%   BMI 28.12 kg/m   Physical Exam Constitutional:      Appearance: Normal appearance.  HENT:     Head: Normocephalic.     Mouth/Throat:     Mouth: Mucous membranes are moist.     Pharynx: Oropharynx is clear.   Eyes:     Extraocular Movements: Extraocular movements intact.     Conjunctiva/sclera: Conjunctivae normal.    Cardiovascular:     Rate and Rhythm: Normal rate and regular rhythm.     Heart sounds: Normal heart sounds. No murmur  heard. Pulmonary:     Effort: Pulmonary effort is normal.     Breath sounds: Normal breath sounds. No wheezing, rhonchi or rales.   Musculoskeletal:     Right lower leg: No edema.     Left lower leg: No edema.   Skin:    General: Skin is warm and dry.   Neurological:     General: No focal deficit present.     Mental Status: She is alert and oriented to person, place, and time.   Psychiatric:        Mood and Affect: Mood normal.        Behavior: Behavior normal.       Assessment & Plan:  Encounter to establish care  Mixed hyperlipidemia Assessment & Plan: Stable. Continue with current management without changes. Discussed healthy diet and lifestyle. Patient to bring recent lab work to office to be scanned into chart.   Orders: -     Pravastatin Sodium; Take 1 tablet (80 mg total) by mouth daily.  Dispense: 90 tablet; Refill: 3    Return in about 6 months (around 08/17/2024).   Charmaine Joda Braatz, PA-C

## 2024-03-23 ENCOUNTER — Other Ambulatory Visit: Payer: Self-pay | Admitting: Physician Assistant

## 2024-03-23 MED ORDER — CLORAZEPATE DIPOTASSIUM 7.5 MG PO TABS: 7.5000 mg | ORAL_TABLET | Freq: Every day | ORAL | 1 refills | Status: DC

## 2024-07-15 ENCOUNTER — Ambulatory Visit

## 2024-08-16 ENCOUNTER — Ambulatory Visit: Admitting: Physician Assistant

## 2024-08-29 ENCOUNTER — Ambulatory Visit (INDEPENDENT_AMBULATORY_CARE_PROVIDER_SITE_OTHER): Admitting: Physician Assistant

## 2024-08-29 ENCOUNTER — Encounter: Payer: Self-pay | Admitting: Physician Assistant

## 2024-08-29 VITALS — BP 162/78 | Temp 97.5°F | Ht 64.0 in | Wt 161.0 lb

## 2024-08-29 DIAGNOSIS — E782 Mixed hyperlipidemia: Secondary | ICD-10-CM

## 2024-08-29 DIAGNOSIS — F411 Generalized anxiety disorder: Secondary | ICD-10-CM | POA: Insufficient documentation

## 2024-08-29 DIAGNOSIS — R030 Elevated blood-pressure reading, without diagnosis of hypertension: Secondary | ICD-10-CM | POA: Diagnosis not present

## 2024-08-29 MED ORDER — CLORAZEPATE DIPOTASSIUM 7.5 MG PO TABS: 7.5000 mg | ORAL_TABLET | Freq: Every day | ORAL | 1 refills | Status: DC

## 2024-08-29 NOTE — Progress Notes (Signed)
 "  Established Patient Office Visit  Subjective   Patient ID: Kimberly Melendez, female    DOB: [birth year]  Age: 84 y.o. MRN: [id number]  Chief Complaint  Patient presents with   Follow-up    6 Month Follow up Asked about blood work results    Discussed the use of AI scribe software for clinical note transcription with the patient, who gave verbal consent to proceed.  History of Present Illness Kimberly Melendez is an 84 year old female who presents for a general 6 month follow-up visit.  She feels well today and has no specific complaints, though she feels more tired and mentions she plans to retire from work in March.  She takes clorazepate  (Tranxene ) 7.5 mg at bedtime for anxiety and sleep and has used it long term, she denies any adverse effects. She recently ran out and used four doses from her sister's supply but wishes to remain on this medication.  She states her blood pressure readings are usually good at home despite elevated readings in office. She denies new chest pain, shortness of breath, or leg swelling.  She notes significant bereavement over the holidays, including the death of her daughter in October 03, 2021 and multiple other losses.   She wishes to have blood work done today.     Review of Systems  Constitutional:  Negative for activity change, appetite change, fatigue and fever.  Eyes:  Negative for visual disturbance.  Respiratory:  Negative for cough and shortness of breath.   Cardiovascular:  Negative for chest pain.  Neurological:  Negative for light-headedness and headaches.  Psychiatric/Behavioral:  Negative for agitation and decreased concentration. The patient is not nervous/anxious.        Objective:     BP (!) 162/78   Temp (!) 97.5 F (36.4 C)   Ht 5' 4 (1.626 m)   Wt 161 lb (73 kg)   SpO2 96%   BMI 27.64 kg/m    Physical Exam Constitutional:      General: She is not in acute distress.    Appearance: Normal appearance. She is normal weight. She is  not ill-appearing.  HENT:     Head: Normocephalic and atraumatic.     Mouth/Throat:     Mouth: Mucous membranes are moist.     Pharynx: Oropharynx is clear.  Eyes:     Extraocular Movements: Extraocular movements intact.     Conjunctiva/sclera: Conjunctivae normal.  Cardiovascular:     Rate and Rhythm: Normal rate and regular rhythm.     Heart sounds: Normal heart sounds. No murmur heard. Pulmonary:     Effort: Pulmonary effort is normal.     Breath sounds: Normal breath sounds. No wheezing, rhonchi or rales.  Musculoskeletal:     Right lower leg: No edema.     Left lower leg: No edema.  Skin:    General: Skin is warm and dry.  Neurological:     General: No focal deficit present.     Mental Status: She is alert and oriented to person, place, and time.  Psychiatric:        Mood and Affect: Mood normal.        Behavior: Behavior normal.      No results found for any visits on 08/29/24.  The ASCVD Risk score (Arnett DK, et al., Oct 03, 2017) failed to calculate for the following reasons:   The 10/03/2017 ASCVD risk score is only valid for ages 37 to 29   * - Cholesterol units were  assumed    Assessment & Plan:   Return in about 4 weeks (around 09/26/2024) for BP.   Mixed hyperlipidemia Assessment & Plan: Stable. Continue with current management without changes. Discussed healthy diet and lifestyle. Lipid panel today.   Orders: -     Lipid panel  Elevated blood pressure reading without diagnosis of hypertension Assessment & Plan: 162/78, reportedly good readings at home.  Above goal. Has not been on blood pressure medication in the past.   No immediate antihypertensive needed. Monitor blood pressure at home for one month. Discussed DASH diet and dietary sodium restrictions.  Scheduled follow-up in early February to reevaluate.    Orders: -     Lipid panel -     CMP14+EGFR -     CBC with Differential/Platelet  Other orders -     Clorazepate  Dipotassium; Take 1 tablet  (7.5 mg total) by mouth at bedtime.  Dispense: 30 tablet; Refill: 1   Trenell Moxey, PA-C "

## 2024-08-29 NOTE — Assessment & Plan Note (Signed)
 162/78, reportedly good readings at home.  Above goal. Has not been on blood pressure medication in the past.   No immediate antihypertensive needed. Monitor blood pressure at home for one month. Discussed DASH diet and dietary sodium restrictions.  Scheduled follow-up in early February to reevaluate.

## 2024-08-29 NOTE — Assessment & Plan Note (Signed)
 Stable. Continue with current management without changes. Discussed healthy diet and lifestyle. Lipid panel today.

## 2024-08-30 ENCOUNTER — Other Ambulatory Visit: Payer: Self-pay | Admitting: Physician Assistant

## 2024-08-30 ENCOUNTER — Ambulatory Visit: Payer: Self-pay | Admitting: Physician Assistant

## 2024-08-30 LAB — CMP14+EGFR
ALT: 13 IU/L (ref 0–32)
AST: 20 IU/L (ref 0–40)
Albumin: 4.2 g/dL (ref 3.7–4.7)
Alkaline Phosphatase: 58 IU/L (ref 48–129)
BUN/Creatinine Ratio: 20 (ref 12–28)
BUN: 13 mg/dL (ref 8–27)
Bilirubin Total: 0.6 mg/dL (ref 0.0–1.2)
CO2: 24 mmol/L (ref 20–29)
Calcium: 9.8 mg/dL (ref 8.7–10.3)
Chloride: 104 mmol/L (ref 96–106)
Creatinine, Ser: 0.64 mg/dL (ref 0.57–1.00)
Globulin, Total: 2.7 g/dL (ref 1.5–4.5)
Glucose: 88 mg/dL (ref 70–99)
Potassium: 4.5 mmol/L (ref 3.5–5.2)
Sodium: 141 mmol/L (ref 134–144)
Total Protein: 6.9 g/dL (ref 6.0–8.5)
eGFR: 89 mL/min/1.73

## 2024-08-30 LAB — CBC WITH DIFFERENTIAL/PLATELET
Basophils Absolute: 0.1 x10E3/uL (ref 0.0–0.2)
Basos: 2 %
EOS (ABSOLUTE): 0.1 x10E3/uL (ref 0.0–0.4)
Eos: 4 %
Hematocrit: 44.5 % (ref 34.0–46.6)
Hemoglobin: 14.3 g/dL (ref 11.1–15.9)
Immature Grans (Abs): 0 x10E3/uL (ref 0.0–0.1)
Immature Granulocytes: 0 %
Lymphocytes Absolute: 1.3 x10E3/uL (ref 0.7–3.1)
Lymphs: 34 %
MCH: 28.7 pg (ref 26.6–33.0)
MCHC: 32.1 g/dL (ref 31.5–35.7)
MCV: 89 fL (ref 79–97)
Monocytes Absolute: 0.4 x10E3/uL (ref 0.1–0.9)
Monocytes: 10 %
Neutrophils Absolute: 1.9 x10E3/uL (ref 1.4–7.0)
Neutrophils: 50 %
Platelets: 263 x10E3/uL (ref 150–450)
RBC: 4.98 x10E6/uL (ref 3.77–5.28)
RDW: 12.3 % (ref 11.7–15.4)
WBC: 3.8 x10E3/uL (ref 3.4–10.8)

## 2024-08-30 LAB — LIPID PANEL
Chol/HDL Ratio: 3.1 ratio (ref 0.0–4.4)
Cholesterol, Total: 211 mg/dL — ABNORMAL HIGH (ref 100–199)
HDL: 67 mg/dL
LDL Chol Calc (NIH): 133 mg/dL — ABNORMAL HIGH (ref 0–99)
Triglycerides: 61 mg/dL (ref 0–149)
VLDL Cholesterol Cal: 11 mg/dL (ref 5–40)

## 2024-08-31 ENCOUNTER — Other Ambulatory Visit: Payer: Self-pay | Admitting: Physician Assistant

## 2024-09-01 ENCOUNTER — Other Ambulatory Visit: Payer: Self-pay

## 2024-09-01 ENCOUNTER — Other Ambulatory Visit: Payer: Self-pay | Admitting: Physician Assistant

## 2024-09-01 ENCOUNTER — Telehealth: Payer: Self-pay

## 2024-09-01 MED ORDER — CLORAZEPATE DIPOTASSIUM 7.5 MG PO TABS: 7.5000 mg | ORAL_TABLET | Freq: Every day | ORAL | 0 refills | Status: AC

## 2024-09-01 NOTE — Telephone Encounter (Signed)
 Pt was recently seen in office and requests medication be resent to pharmacy , she is in the process of weaning off of it

## 2024-09-01 NOTE — Telephone Encounter (Unsigned)
 Copied from CRM #[id number]. Topic: Clinical - Prescription Issue >> Sep 01, 2024  8:51 AM Tonda B wrote: Reason for CRM: pt has questions about her rx Pending Clorazepate  Dipotassium 7.5 mg  [phone]

## 2024-09-20 ENCOUNTER — Other Ambulatory Visit: Payer: Self-pay

## 2024-09-20 ENCOUNTER — Telehealth: Payer: Self-pay

## 2024-09-20 NOTE — Telephone Encounter (Signed)
 Left voicemail for patient to call office back.

## 2024-09-20 NOTE — Telephone Encounter (Signed)
 Copied from CRM [id number]. Topic: Clinical - Medication Refill >> Sep 20, 2024 10:48 AM Gustabo D wrote: Medication: clorazepate  (TRANXENE ) 7.5 MG tablet  Has the patient contacted their pharmacy? No (Agent: If no, request that the patient contact the pharmacy for the refill. If patient does not wish to contact the pharmacy document the reason why and proceed with request.) (Agent: If yes, when and what did the pharmacy advise?)  This is the patient's preferred pharmacy:  [id number] - [place], [place] - [address] AT [facility] [address] [place] [address] Phone: [phone] Fax: [phone]  Is this the correct pharmacy for this prescription? Yes If no, delete pharmacy and type the correct one.   Has the prescription been filled recently? No  Is the patient out of the medication? Yes  Has the patient been seen for an appointment in the last year OR does the patient have an upcoming appointment? Yes  Can we respond through MyChart? No  Agent: Please be advised that Rx refills may take up to 3 business days. We ask that you follow-up with your pharmacy.

## 2024-09-20 NOTE — Telephone Encounter (Signed)
 Spoke with patient and advised medication cannot be refilled until 10/02/24, patient understanding.

## 2024-09-26 ENCOUNTER — Ambulatory Visit: Admitting: Physician Assistant
